# Patient Record
Sex: Male | Born: 2000
Health system: Southern US, Community
[De-identification: ages and names within clinical notes are randomized; demographics above are authoritative.]

## PROBLEM LIST (undated history)

## (undated) DIAGNOSIS — R278 Other lack of coordination: Secondary | ICD-10-CM

## (undated) DIAGNOSIS — R488 Other symbolic dysfunctions: Secondary | ICD-10-CM

## (undated) DIAGNOSIS — R41840 Attention and concentration deficit: Secondary | ICD-10-CM

## (undated) DIAGNOSIS — F419 Anxiety disorder, unspecified: Secondary | ICD-10-CM

## (undated) HISTORY — PX: HERNIA REPAIR: SHX51

## (undated) HISTORY — DX: Anxiety disorder, unspecified: F41.9

## (undated) HISTORY — DX: Other symbolic dysfunctions: R48.8

## (undated) HISTORY — DX: Other lack of coordination: R27.8

---
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
## 2018-01-10 ENCOUNTER — Other Ambulatory Visit: Payer: Self-pay | Admitting: Pediatrics

## 2018-01-11 MED ORDER — GUANFACINE HCL ER 4 MG PO TB24: 4.0000 mg | ORAL_TABLET | Freq: Every day | ORAL | 0 refills | Status: DC

## 2018-01-11 NOTE — Telephone Encounter (Signed)
RX for above e-scribed and sent to pharmacy on record  [place] [facility] [place], [place] - [address] [place] [address] Phone: [phone] Fax: [phone]    

## 2018-01-11 NOTE — Telephone Encounter (Signed)
Last  Visit 10/30/2017 Next Visit 03/02/18

## 2018-01-29 ENCOUNTER — Other Ambulatory Visit: Payer: Self-pay

## 2018-01-29 MED ORDER — METHYLPHENIDATE HCL ER (OSM) 72 MG PO TBCR: 1.0000 | EXTENDED_RELEASE_TABLET | Freq: Every morning | ORAL | 0 refills | Status: DC

## 2018-01-29 NOTE — Telephone Encounter (Signed)
Mom called in for refill for Concerta. Last visit 10/30/2017 next visit 02/20/2018. Please escribe to [facility] in [facility]

## 2018-02-20 ENCOUNTER — Encounter: Payer: Self-pay | Admitting: Pediatrics

## 2018-02-20 ENCOUNTER — Ambulatory Visit (INDEPENDENT_AMBULATORY_CARE_PROVIDER_SITE_OTHER): Payer: PRIVATE HEALTH INSURANCE | Admitting: Pediatrics

## 2018-02-20 VITALS — BP 120/80 | Ht 72.5 in | Wt 175.0 lb

## 2018-02-20 DIAGNOSIS — R278 Other lack of coordination: Secondary | ICD-10-CM

## 2018-02-20 DIAGNOSIS — R488 Other symbolic dysfunctions: Secondary | ICD-10-CM

## 2018-02-20 DIAGNOSIS — Z79899 Other long term (current) drug therapy: Secondary | ICD-10-CM

## 2018-02-20 DIAGNOSIS — Z719 Counseling, unspecified: Secondary | ICD-10-CM

## 2018-02-20 DIAGNOSIS — F902 Attention-deficit hyperactivity disorder, combined type: Secondary | ICD-10-CM

## 2018-02-20 DIAGNOSIS — F411 Generalized anxiety disorder: Secondary | ICD-10-CM

## 2018-02-20 DIAGNOSIS — Z7189 Other specified counseling: Secondary | ICD-10-CM

## 2018-02-20 MED ORDER — GUANFACINE HCL ER 4 MG PO TB24: 4.0000 mg | ORAL_TABLET | Freq: Every day | ORAL | 0 refills | Status: AC

## 2018-02-20 MED ORDER — METHYLPHENIDATE HCL ER (OSM) 72 MG PO TBCR: 1.0000 | EXTENDED_RELEASE_TABLET | Freq: Every morning | ORAL | 0 refills | Status: AC

## 2018-02-20 MED ORDER — ATOMOXETINE HCL 100 MG PO CAPS: 100.0000 mg | ORAL_CAPSULE | Freq: Every day | ORAL | 0 refills | Status: AC

## 2018-02-20 NOTE — Patient Instructions (Addendum)
Continue strattera 100 mg daily  intuniv 4 mg daily  concerta 72 mg every morning Discussed medication and dosing Discussed growth and development-good BMI Discussed sports injury-strongly recommend PT, has referral Discussed relationships Discussed educational future-will continue at [employer] until he decides what he wants to do
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
## 2019-10-23 ENCOUNTER — Encounter (HOSPITAL_COMMUNITY): Payer: Self-pay | Admitting: Emergency Medicine

## 2019-10-23 ENCOUNTER — Other Ambulatory Visit: Payer: Self-pay

## 2019-10-23 ENCOUNTER — Emergency Department (HOSPITAL_COMMUNITY): Payer: BC Managed Care – PPO

## 2019-10-23 ENCOUNTER — Emergency Department (HOSPITAL_COMMUNITY)
Admission: EM | Admit: 2019-10-23 | Discharge: 2019-10-24 | Disposition: A | Discharge status: Home / Self Care | Payer: BC Managed Care – PPO | Attending: Emergency Medicine | Admitting: Emergency Medicine

## 2019-10-23 DIAGNOSIS — N50811 Right testicular pain: Secondary | ICD-10-CM | POA: Insufficient documentation

## 2019-10-23 DIAGNOSIS — N452 Orchitis: Secondary | ICD-10-CM | POA: Insufficient documentation

## 2019-10-23 DIAGNOSIS — N50819 Testicular pain, unspecified: Secondary | ICD-10-CM

## 2019-10-23 LAB — URINALYSIS, ROUTINE W REFLEX MICROSCOPIC
Bilirubin Urine: NEGATIVE
Glucose, UA: NEGATIVE mg/dL
Hgb urine dipstick: NEGATIVE
Ketones, ur: NEGATIVE mg/dL
Leukocytes,Ua: NEGATIVE
Nitrite: NEGATIVE
Protein, ur: NEGATIVE mg/dL
Specific Gravity, Urine: 1.027 (ref 1.005–1.030)
pH: 5 (ref 5.0–8.0)

## 2019-10-23 NOTE — ED Triage Notes (Signed)
Pt c/o increase testicle pain for the past few days. Seen on UC for same pt states he doesn't have any relief.

## 2019-10-24 MED ORDER — KETOROLAC TROMETHAMINE 30 MG/ML IJ SOLN
30.0000 mg | Freq: Once | INTRAMUSCULAR | Status: AC
  Administered 2019-10-24: 30 mg via INTRAMUSCULAR
  Filled 2019-10-24: qty 1

## 2019-10-24 MED ORDER — CEFTRIAXONE SODIUM 500 MG IJ SOLR
500.0000 mg | Freq: Once | INTRAMUSCULAR | Status: AC
  Administered 2019-10-24: 500 mg via INTRAMUSCULAR
  Filled 2019-10-24: qty 500

## 2019-10-24 MED ORDER — DOXYCYCLINE HYCLATE 100 MG PO CAPS
100.0000 mg | ORAL_CAPSULE | Freq: Two times a day (BID) | ORAL | 0 refills | Status: AC
Start: 2019-10-24 — End: 2019-11-03

## 2019-10-24 MED ORDER — LIDOCAINE HCL (PF) 1 % IJ SOLN
INTRAMUSCULAR | Status: AC
  Administered 2019-10-24: 5 mL
  Filled 2019-10-24: qty 5

## 2019-10-24 NOTE — Discharge Instructions (Addendum)
I have prescribed antibiotics in order to help treat your likely infection.  Please take 1 tablet twice a day for the next 10 days.  You will need to schedule an appointment with [facility] in order to reevaluate your right testicular swelling.

## 2019-10-24 NOTE — ED Provider Notes (Signed)
[facility] EMERGENCY DEPARTMENT Provider Note   CSN: [id number] Arrival date & time: 10/23/19  1353     History Chief Complaint  Patient presents with  . Testicle Pain    Jonathan French is a 19 y.o. male.  19 y.o male with a  PMH of Anxiety, ADHD presents to the ED with a chief complaint of right testicular pain for the past few days. Patient was evaluated at [facility] on two days ago and diagnosed with inguinal hernia. He reports no improvement in his symptoms.  Has been taking ibuprofen without relieved. He does report some discomfort after voiding but no dysuria or hematuria. Patient is currently sexually active but reports using condoms, last activity about 2 months ago. He denies any fever, abdominal pain, nausea, or vomiting.    The history is provided by the patient.  Testicle Pain Pertinent negatives include no chest pain and no shortness of breath.       Past Medical History:  Diagnosis Date  . Anxiety   . Attention deficit   . Developmental dysgraphia     Patient Active Problem List   Diagnosis Date Noted  . ADHD (attention deficit hyperactivity disorder), combined type 05/29/2015  . Dysgraphia 05/29/2015  . Generalized anxiety disorder 05/29/2015    Past Surgical History:  Procedure Laterality Date  . HERNIA REPAIR         Family History  Adopted: Yes  Family history unknown: Yes    Social History   Tobacco Use  . Smoking status: Never Smoker  . Smokeless tobacco: Never Used  Substance Use Topics  . Alcohol use: No    Alcohol/week: 0.0 standard drinks  . Drug use: No    Home Medications Prior to Admission medications   Medication Sig Start Date End Date Taking? Authorizing Provider  ibuprofen (ADVIL) 600 MG tablet Take 600 mg by mouth every 6 (six) hours as needed for mild pain.  10/22/19 11/01/19 Yes [provider]  atomoxetine (STRATTERA) 100 MG capsule Take 1 capsule (100 mg total) by mouth daily. Patient not  taking: Reported on 10/24/2019 02/20/18   Nicholos Johns, NP  doxycycline (VIBRAMYCIN) 100 MG capsule Take 1 capsule (100 mg total) by mouth 2 (two) times daily for 10 days. 10/24/19 11/03/19  Claude Manges, PA-C  guanFACINE (INTUNIV) 4 MG TB24 ER tablet Take 1 tablet (4 mg total) by mouth daily. Patient not taking: Reported on 10/24/2019 02/20/18   Nicholos Johns, NP  Methylphenidate HCl ER 72 MG TBCR Take 1 tablet by mouth every morning. Patient not taking: Reported on 10/24/2019 02/20/18   Nicholos Johns, NP    Allergies    Patient has no known allergies.  Review of Systems   Review of Systems  Constitutional: Negative for chills and fever.  Respiratory: Negative for shortness of breath.   Cardiovascular: Negative for chest pain.  Genitourinary: Positive for scrotal swelling and testicular pain. Negative for discharge, genital sores, hematuria, penile pain and penile swelling.  All other systems reviewed and are negative.   Physical Exam Updated Vital Signs BP (!) 140/53   Pulse (!) 53   Temp 98.1 F (36.7 C) (Oral)   Resp 16   Ht 6' (1.829 m)   Wt 79.4 kg   SpO2 98%   BMI 23.74 kg/m   Physical Exam  ED Results / Procedures / Treatments   Labs (all labs ordered are listed, but only abnormal results are displayed) Labs Reviewed  URINALYSIS, ROUTINE  W REFLEX MICROSCOPIC  GC/CHLAMYDIA PROBE AMP ([place]) NOT AT [facility]    EKG None  Radiology US SCROTUM W/DOPPLER  Result Date: 10/23/2019 CLINICAL DATA:  Right testicular pain EXAM: SCROTAL ULTRASOUND DOPPLER ULTRASOUND OF THE TESTICLES TECHNIQUE: Complete ultrasound examination of the testicles, epididymis, and other scrotal structures was performed. Color and spectral Doppler ultrasound were also utilized to evaluate blood flow to the testicles. COMPARISON:  None. FINDINGS: Right testicle Measurements: 4.6 x 2.6 x 3.4 cm. No mass or microlithiasis visualized. Left testicle Measurements: 4.3 x 2.3 x 3.2 cm. There is no mass.  There is increased vascularity. Right epididymis:  There is an epididymal head cyst measuring 5 mm. Left epididymis:  Normal in size and appearance. Hydrocele:  There are trace to small bilateral hydroceles. Varicocele:  There is a borderline left-sided varicocele. Pulsed Doppler interrogation of both testes demonstrates normal low resistance arterial and venous waveforms bilaterally. IMPRESSION: 1. No evidence for testicular torsion. 2. Hypervascularity of the LEFT testicle can be seen in patients with orchitis. 3. No testicular mass. 4. Borderline left-sided varicocele. 5. Small bilateral hydroceles. Electronically Signed   By: Katherine Mantle M.D.   On: 10/23/2019 16:01    Procedures Procedures (including critical care time)  Medications Ordered in ED Medications  cefTRIAXone (ROCEPHIN) injection 500 mg (500 mg Intramuscular Given 10/24/19 0258)  ketorolac (TORADOL) 30 MG/ML injection 30 mg (30 mg Intramuscular Given 10/24/19 0301)  lidocaine (PF) (XYLOCAINE) 1 % injection (5 mLs  Given 10/24/19 0302)    ED Course  I have reviewed the triage vital signs and the nursing notes.  Pertinent labs & imaging results that were available during my care of the patient were reviewed by me and considered in my medical decision making (see chart for details).    MDM Rules/Calculators/A&P  Patient with no pertinent past medical history presents to the ED with complaints of right testicular pain which began 3 days ago, seen by urgent care and diagnosed with a right inguinal hernia, given ibuprofen for symptomatic relief reports no improvement at this time.  Sexually active, last encounter about 2 months ago, reports using condoms for interactions.  Has not had any penile discharge, penile complaints.  Ultrasound of his scrotum was obtained which showed:  1. No evidence for testicular torsion.  2. Hypervascularity of the LEFT testicle can be seen in patients  with orchitis.  3. No testicular mass.  4.  Borderline left-sided varicocele.  5. Small bilateral hydroceles.     UA without any nitrites, leukocytes, no signs of infection.  GC and chlamydia have been added to patient's urine.  Denies any penile discharge, does have right testicular swelling, examined with nurse Jasmine at the bedside.  Tender to palpation along with mild erythema noted to right testicle.  Patient received in the ED a shot of Rocephin to cover for gonorrhea, he will also be sent home on doxycycline to help with chlamydia treatment.  I discussed with him that he will need to follow-up with [facility] as this is relevant to his care.  Patient is agreeable at this time.  Also received a shot of Toradol to help with swelling.  Vitals remained stable, patient stable for discharge.  Portions of this note were generated with Scientist, clinical (histocompatibility and immunogenetics). Dictation errors may occur despite best attempts at proofreading.  Final Clinical Impression(s) / ED Diagnoses Final diagnoses:  Pain in right testicle  Orchitis    Rx / DC Orders ED Discharge Orders  Ordered    doxycycline (VIBRAMYCIN) 100 MG capsule  2 times daily     Discontinue  Reprint     10/24/19 0245           Claude Manges, PA-C 10/24/19 0343    Melene Plan, DO 10/24/19 [phone]

## 2019-10-25 LAB — GC/CHLAMYDIA PROBE AMP (~~LOC~~) NOT AT ARMC
Chlamydia: NEGATIVE
Comment: NEGATIVE
Comment: NORMAL
Neisseria Gonorrhea: NEGATIVE

## 2020-06-03 DIAGNOSIS — J0110 Acute frontal sinusitis, unspecified: Secondary | ICD-10-CM | POA: Diagnosis not present

## 2020-06-10 DIAGNOSIS — M25551 Pain in right hip: Secondary | ICD-10-CM | POA: Diagnosis not present

## 2020-06-10 DIAGNOSIS — M25552 Pain in left hip: Secondary | ICD-10-CM | POA: Diagnosis not present

## 2020-06-10 DIAGNOSIS — Z4789 Encounter for other orthopedic aftercare: Secondary | ICD-10-CM | POA: Diagnosis not present

## 2020-06-22 DIAGNOSIS — Z720 Tobacco use: Secondary | ICD-10-CM | POA: Diagnosis not present

## 2020-06-22 DIAGNOSIS — N50819 Testicular pain, unspecified: Secondary | ICD-10-CM | POA: Diagnosis not present

## 2020-06-22 DIAGNOSIS — Z87438 Personal history of other diseases of male genital organs: Secondary | ICD-10-CM | POA: Diagnosis not present

## 2020-06-22 DIAGNOSIS — R1030 Lower abdominal pain, unspecified: Secondary | ICD-10-CM | POA: Diagnosis not present

## 2020-06-22 DIAGNOSIS — N433 Hydrocele, unspecified: Secondary | ICD-10-CM | POA: Diagnosis not present

## 2020-06-22 DIAGNOSIS — N50811 Right testicular pain: Secondary | ICD-10-CM | POA: Diagnosis not present

## 2020-06-29 DIAGNOSIS — M25552 Pain in left hip: Secondary | ICD-10-CM | POA: Diagnosis not present

## 2020-06-29 DIAGNOSIS — M25551 Pain in right hip: Secondary | ICD-10-CM | POA: Diagnosis not present

## 2020-06-29 DIAGNOSIS — Z4789 Encounter for other orthopedic aftercare: Secondary | ICD-10-CM | POA: Diagnosis not present

## 2020-08-03 DIAGNOSIS — M76821 Posterior tibial tendinitis, right leg: Secondary | ICD-10-CM | POA: Diagnosis not present

## 2020-08-03 DIAGNOSIS — M7671 Peroneal tendinitis, right leg: Secondary | ICD-10-CM | POA: Diagnosis not present

## 2020-08-03 DIAGNOSIS — M25571 Pain in right ankle and joints of right foot: Secondary | ICD-10-CM | POA: Diagnosis not present

## 2020-08-03 DIAGNOSIS — M79671 Pain in right foot: Secondary | ICD-10-CM | POA: Diagnosis not present

## 2020-08-04 DIAGNOSIS — M25552 Pain in left hip: Secondary | ICD-10-CM | POA: Diagnosis not present

## 2020-08-04 DIAGNOSIS — Z9889 Other specified postprocedural states: Secondary | ICD-10-CM | POA: Diagnosis not present

## 2020-08-04 DIAGNOSIS — M25551 Pain in right hip: Secondary | ICD-10-CM | POA: Diagnosis not present

## 2020-09-02 DIAGNOSIS — K4090 Unilateral inguinal hernia, without obstruction or gangrene, not specified as recurrent: Secondary | ICD-10-CM | POA: Diagnosis not present

## 2020-09-03 DIAGNOSIS — K4090 Unilateral inguinal hernia, without obstruction or gangrene, not specified as recurrent: Secondary | ICD-10-CM | POA: Diagnosis not present

## 2020-09-03 DIAGNOSIS — D176 Benign lipomatous neoplasm of spermatic cord: Secondary | ICD-10-CM | POA: Diagnosis not present

## 2020-09-14 DIAGNOSIS — S86311A Strain of muscle(s) and tendon(s) of peroneal muscle group at lower leg level, right leg, initial encounter: Secondary | ICD-10-CM | POA: Diagnosis not present

## 2020-09-14 DIAGNOSIS — S93491A Sprain of other ligament of right ankle, initial encounter: Secondary | ICD-10-CM | POA: Diagnosis not present

## 2020-09-14 DIAGNOSIS — M7671 Peroneal tendinitis, right leg: Secondary | ICD-10-CM | POA: Diagnosis not present

## 2020-09-14 DIAGNOSIS — X500XXA Overexertion from strenuous movement or load, initial encounter: Secondary | ICD-10-CM | POA: Diagnosis not present

## 2020-09-22 DIAGNOSIS — F902 Attention-deficit hyperactivity disorder, combined type: Secondary | ICD-10-CM | POA: Diagnosis not present

## 2020-09-22 DIAGNOSIS — F4323 Adjustment disorder with mixed anxiety and depressed mood: Secondary | ICD-10-CM | POA: Diagnosis not present

## 2020-09-22 DIAGNOSIS — Z561 Change of job: Secondary | ICD-10-CM | POA: Diagnosis not present

## 2020-09-22 DIAGNOSIS — Z563 Stressful work schedule: Secondary | ICD-10-CM | POA: Diagnosis not present

## 2020-09-28 DIAGNOSIS — F4323 Adjustment disorder with mixed anxiety and depressed mood: Secondary | ICD-10-CM | POA: Diagnosis not present

## 2020-09-28 DIAGNOSIS — F901 Attention-deficit hyperactivity disorder, predominantly hyperactive type: Secondary | ICD-10-CM | POA: Diagnosis not present

## 2020-09-29 DIAGNOSIS — F4323 Adjustment disorder with mixed anxiety and depressed mood: Secondary | ICD-10-CM | POA: Diagnosis not present

## 2020-09-29 DIAGNOSIS — Z561 Change of job: Secondary | ICD-10-CM | POA: Diagnosis not present

## 2020-09-29 DIAGNOSIS — F5105 Insomnia due to other mental disorder: Secondary | ICD-10-CM | POA: Diagnosis not present

## 2020-09-29 DIAGNOSIS — F902 Attention-deficit hyperactivity disorder, combined type: Secondary | ICD-10-CM | POA: Diagnosis not present

## 2020-10-08 DIAGNOSIS — F411 Generalized anxiety disorder: Secondary | ICD-10-CM | POA: Diagnosis not present

## 2020-10-08 DIAGNOSIS — Z561 Change of job: Secondary | ICD-10-CM | POA: Diagnosis not present

## 2020-10-08 DIAGNOSIS — F5105 Insomnia due to other mental disorder: Secondary | ICD-10-CM | POA: Diagnosis not present

## 2020-10-08 DIAGNOSIS — F902 Attention-deficit hyperactivity disorder, combined type: Secondary | ICD-10-CM | POA: Diagnosis not present

## 2020-10-12 DIAGNOSIS — F4310 Post-traumatic stress disorder, unspecified: Secondary | ICD-10-CM | POA: Diagnosis not present

## 2020-10-12 DIAGNOSIS — F411 Generalized anxiety disorder: Secondary | ICD-10-CM | POA: Diagnosis not present

## 2020-10-12 DIAGNOSIS — F909 Attention-deficit hyperactivity disorder, unspecified type: Secondary | ICD-10-CM | POA: Diagnosis not present

## 2020-10-19 DIAGNOSIS — F4323 Adjustment disorder with mixed anxiety and depressed mood: Secondary | ICD-10-CM | POA: Diagnosis not present

## 2020-10-19 DIAGNOSIS — F5105 Insomnia due to other mental disorder: Secondary | ICD-10-CM | POA: Diagnosis not present

## 2020-10-19 DIAGNOSIS — F411 Generalized anxiety disorder: Secondary | ICD-10-CM | POA: Diagnosis not present

## 2020-10-19 DIAGNOSIS — F902 Attention-deficit hyperactivity disorder, combined type: Secondary | ICD-10-CM | POA: Diagnosis not present

## 2020-10-20 DIAGNOSIS — F909 Attention-deficit hyperactivity disorder, unspecified type: Secondary | ICD-10-CM | POA: Diagnosis not present

## 2020-10-20 DIAGNOSIS — F411 Generalized anxiety disorder: Secondary | ICD-10-CM | POA: Diagnosis not present

## 2020-10-20 DIAGNOSIS — F4310 Post-traumatic stress disorder, unspecified: Secondary | ICD-10-CM | POA: Diagnosis not present

## 2020-10-26 DIAGNOSIS — F411 Generalized anxiety disorder: Secondary | ICD-10-CM | POA: Diagnosis not present

## 2020-10-26 DIAGNOSIS — F4310 Post-traumatic stress disorder, unspecified: Secondary | ICD-10-CM | POA: Diagnosis not present

## 2020-10-26 DIAGNOSIS — F909 Attention-deficit hyperactivity disorder, unspecified type: Secondary | ICD-10-CM | POA: Diagnosis not present

## 2020-11-02 DIAGNOSIS — F909 Attention-deficit hyperactivity disorder, unspecified type: Secondary | ICD-10-CM | POA: Diagnosis not present

## 2020-11-02 DIAGNOSIS — F411 Generalized anxiety disorder: Secondary | ICD-10-CM | POA: Diagnosis not present

## 2020-11-02 DIAGNOSIS — F4310 Post-traumatic stress disorder, unspecified: Secondary | ICD-10-CM | POA: Diagnosis not present

## 2020-11-05 DIAGNOSIS — F902 Attention-deficit hyperactivity disorder, combined type: Secondary | ICD-10-CM | POA: Diagnosis not present

## 2020-11-05 DIAGNOSIS — F411 Generalized anxiety disorder: Secondary | ICD-10-CM | POA: Diagnosis not present

## 2020-11-05 DIAGNOSIS — F4323 Adjustment disorder with mixed anxiety and depressed mood: Secondary | ICD-10-CM | POA: Diagnosis not present

## 2020-11-05 DIAGNOSIS — F5105 Insomnia due to other mental disorder: Secondary | ICD-10-CM | POA: Diagnosis not present

## 2020-11-09 DIAGNOSIS — F909 Attention-deficit hyperactivity disorder, unspecified type: Secondary | ICD-10-CM | POA: Diagnosis not present

## 2020-11-09 DIAGNOSIS — F411 Generalized anxiety disorder: Secondary | ICD-10-CM | POA: Diagnosis not present

## 2020-11-09 DIAGNOSIS — F4310 Post-traumatic stress disorder, unspecified: Secondary | ICD-10-CM | POA: Diagnosis not present

## 2020-12-28 DIAGNOSIS — F4323 Adjustment disorder with mixed anxiety and depressed mood: Secondary | ICD-10-CM | POA: Diagnosis not present

## 2020-12-28 DIAGNOSIS — F5105 Insomnia due to other mental disorder: Secondary | ICD-10-CM | POA: Diagnosis not present

## 2020-12-28 DIAGNOSIS — F902 Attention-deficit hyperactivity disorder, combined type: Secondary | ICD-10-CM | POA: Diagnosis not present

## 2020-12-28 DIAGNOSIS — F411 Generalized anxiety disorder: Secondary | ICD-10-CM | POA: Diagnosis not present

## 2021-03-11 DIAGNOSIS — F411 Generalized anxiety disorder: Secondary | ICD-10-CM | POA: Diagnosis not present

## 2021-03-11 DIAGNOSIS — F4323 Adjustment disorder with mixed anxiety and depressed mood: Secondary | ICD-10-CM | POA: Diagnosis not present

## 2021-03-11 DIAGNOSIS — F902 Attention-deficit hyperactivity disorder, combined type: Secondary | ICD-10-CM | POA: Diagnosis not present

## 2021-03-11 DIAGNOSIS — F5105 Insomnia due to other mental disorder: Secondary | ICD-10-CM | POA: Diagnosis not present

## 2021-06-21 DIAGNOSIS — F411 Generalized anxiety disorder: Secondary | ICD-10-CM | POA: Diagnosis not present

## 2021-06-21 DIAGNOSIS — F5105 Insomnia due to other mental disorder: Secondary | ICD-10-CM | POA: Diagnosis not present

## 2021-06-21 DIAGNOSIS — F902 Attention-deficit hyperactivity disorder, combined type: Secondary | ICD-10-CM | POA: Diagnosis not present

## 2021-06-21 DIAGNOSIS — F4323 Adjustment disorder with mixed anxiety and depressed mood: Secondary | ICD-10-CM | POA: Diagnosis not present

## 2021-08-12 DIAGNOSIS — F902 Attention-deficit hyperactivity disorder, combined type: Secondary | ICD-10-CM | POA: Diagnosis not present

## 2021-08-12 DIAGNOSIS — F5105 Insomnia due to other mental disorder: Secondary | ICD-10-CM | POA: Diagnosis not present

## 2021-08-12 DIAGNOSIS — F411 Generalized anxiety disorder: Secondary | ICD-10-CM | POA: Diagnosis not present

## 2022-02-25 IMAGING — US US SCROTUM W/ DOPPLER COMPLETE
1 series · 14 of 25 positions shown · non-contrast
Comparison: None.

CLINICAL DATA: Right testicular pain

EXAM:
SCROTAL ULTRASOUND
DOPPLER ULTRASOUND OF THE TESTICLES
TECHNIQUE: Complete ultrasound examination of the testicles, epididymis, and
other scrotal structures was performed. Color and spectral Doppler
ultrasound were also utilized to evaluate blood flow to the
testicles.

[Series 1: us scrotum w/doppler · 14 of 74 slices shown]
[im 1/74]
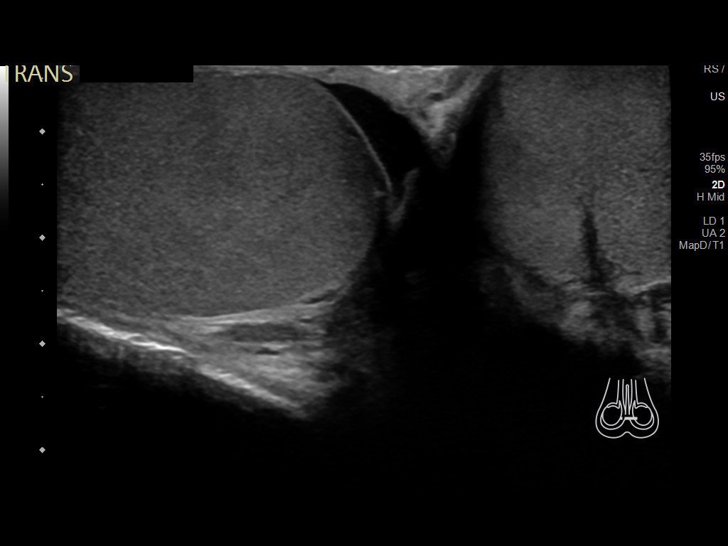
[im 7/74]
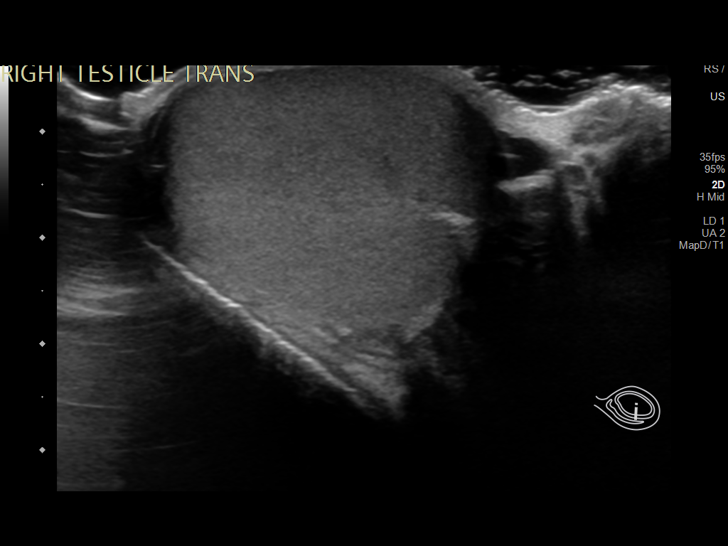
[im 13/74]
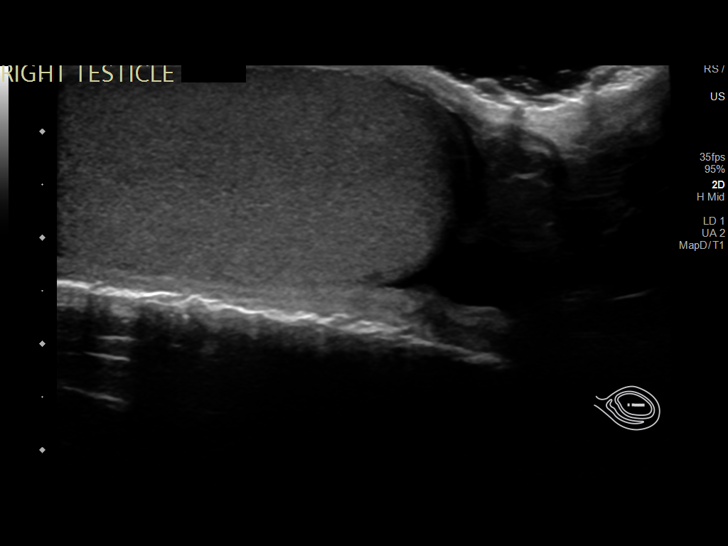
[im 19/74]
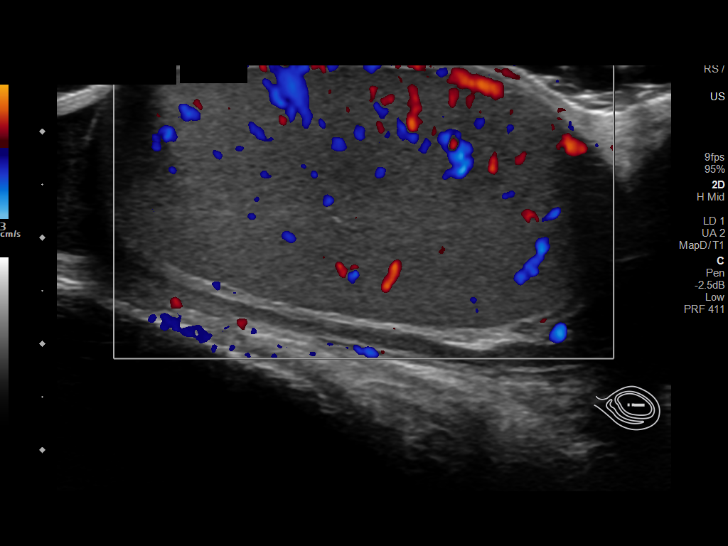
[im 25/74]
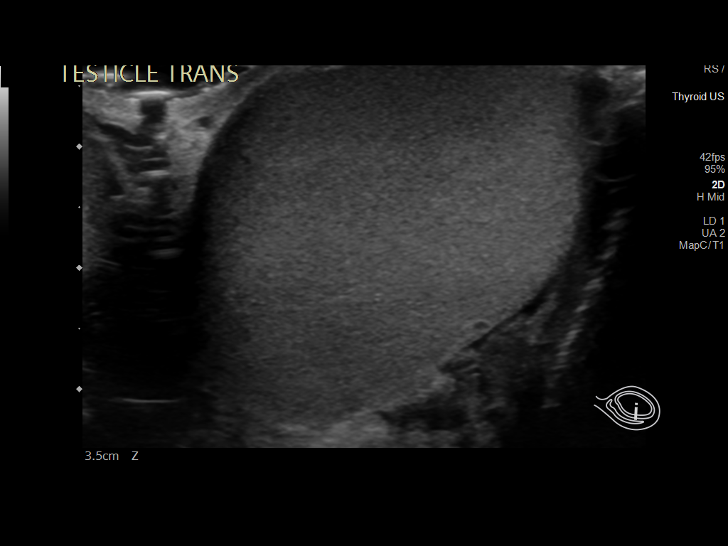
[im 28/74]
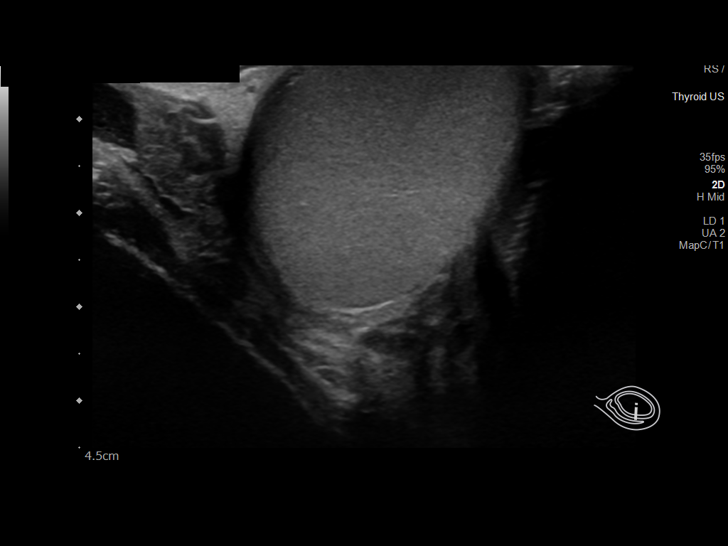
[im 34/74]
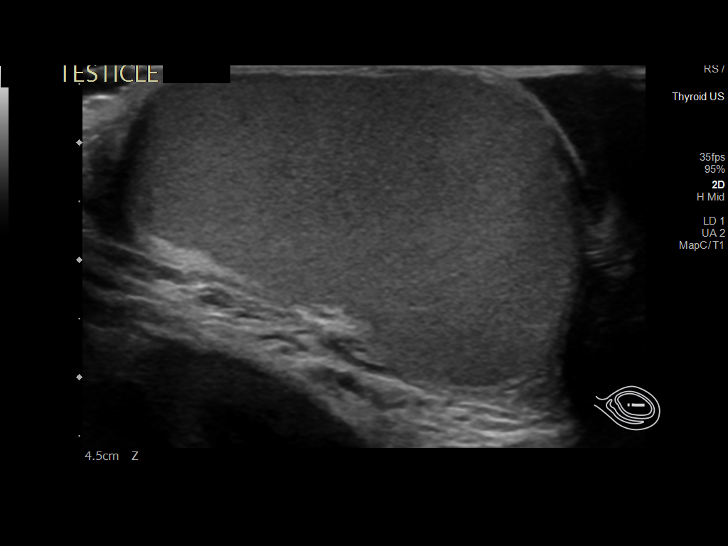
[im 40/74]
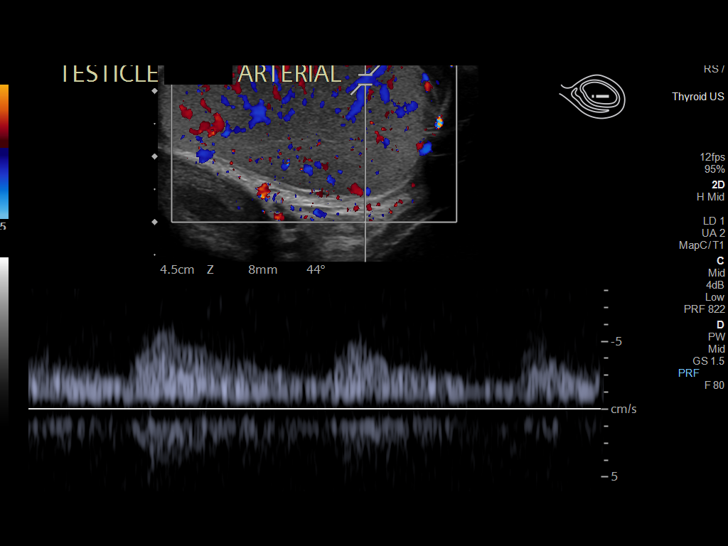
[im 46/74]
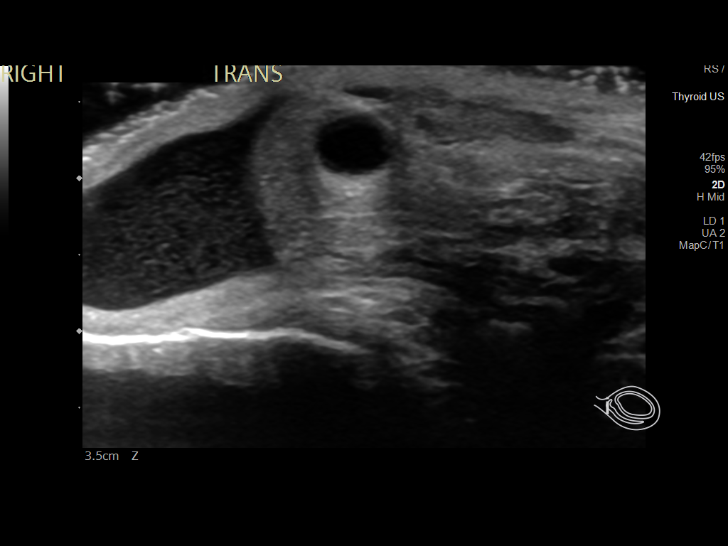
[im 49/74]
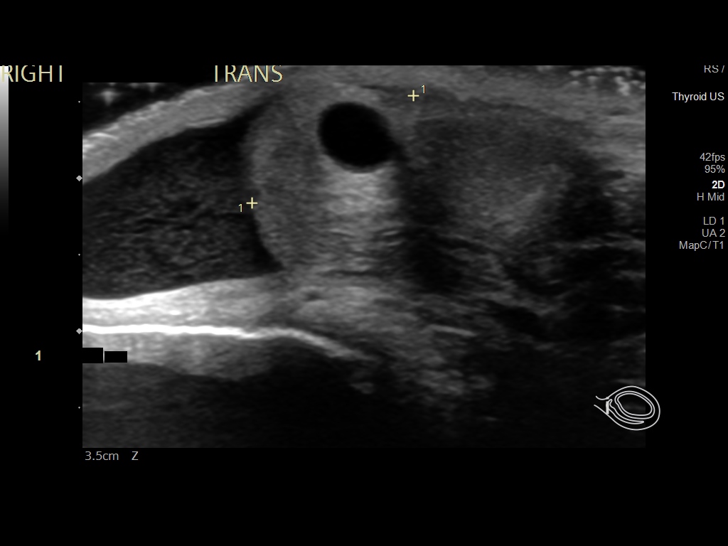
[im 55/74]
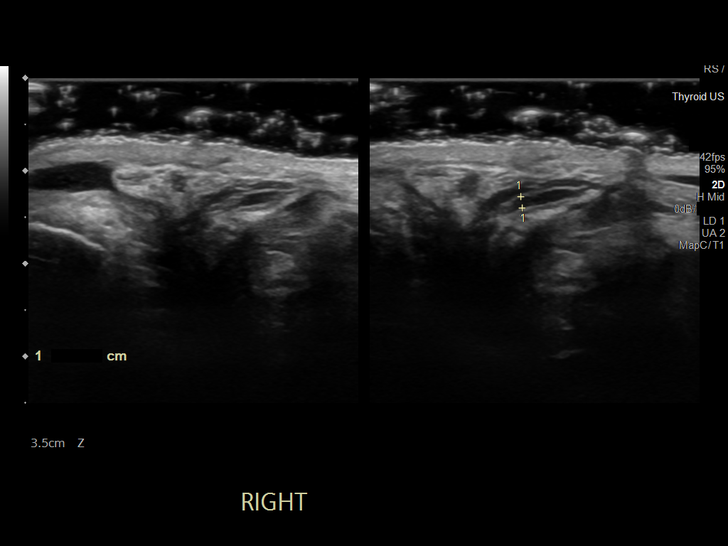
[im 61/74]
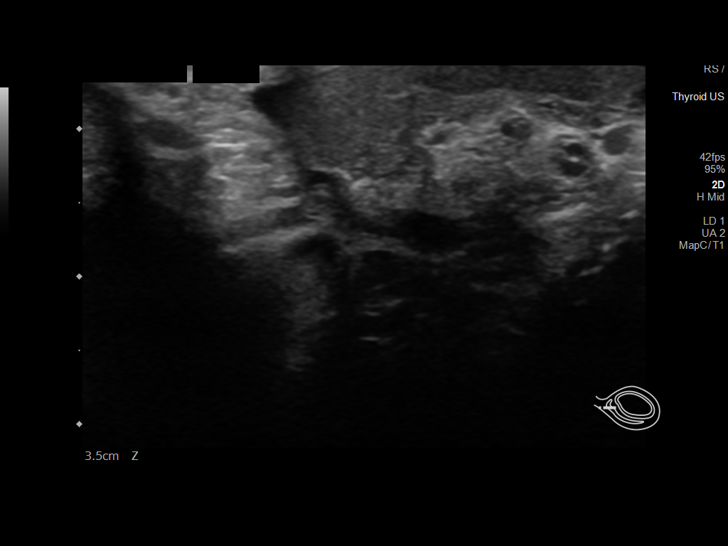
[im 67/74]
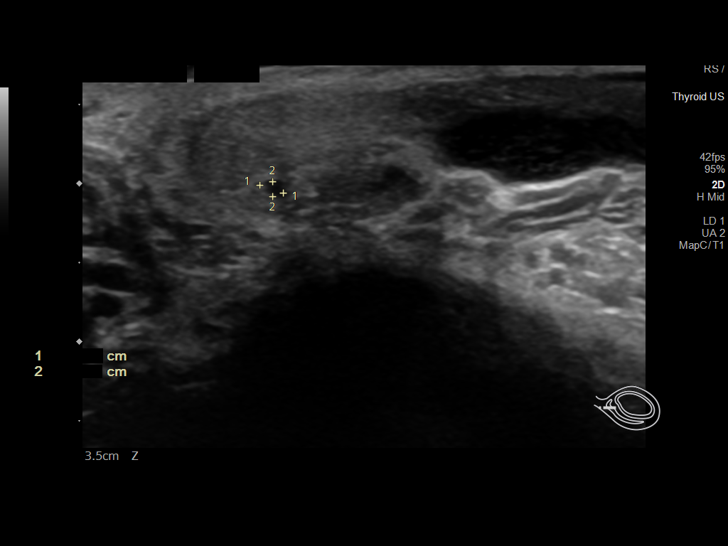
[im 74/74]
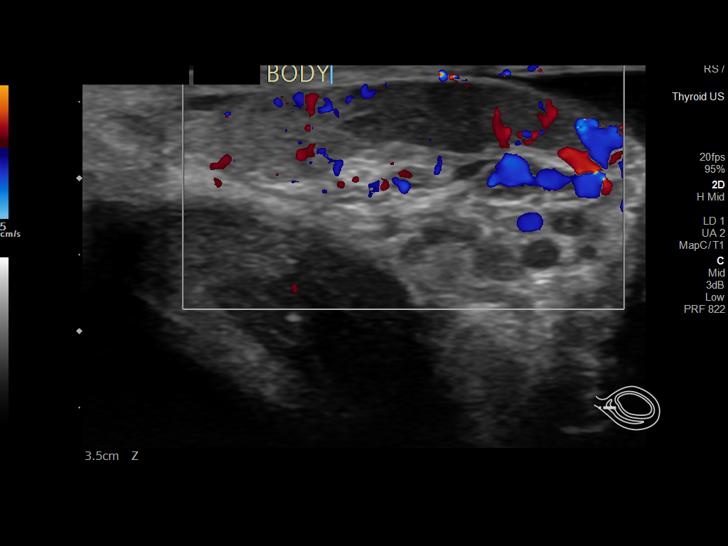

[14 of 25 positions shown; findings below may reference images not displayed]

FINDINGS: Right testicle

Measurements: 4.6 x 2.6 x 3.4 cm. No mass or microlithiasis
visualized.

Left testicle

Measurements: 4.3 x 2.3 x 3.2 cm. There is no mass. There is
increased vascularity.

Right epididymis:  There is an epididymal head cyst measuring 5 mm.

Left epididymis:  Normal in size and appearance.

Hydrocele:  There are trace to small bilateral hydroceles.

Varicocele:  There is a borderline left-sided varicocele.

Pulsed Doppler interrogation of both testes demonstrates normal low
resistance arterial and venous waveforms bilaterally.
IMPRESSION: 1. No evidence for testicular torsion.
2. Hypervascularity of the LEFT testicle can be seen in patients
with orchitis.
3. No testicular mass.
4. Borderline left-sided varicocele.
5. Small bilateral hydroceles.

## 2022-03-24 DIAGNOSIS — R000 Tachycardia, unspecified: Secondary | ICD-10-CM | POA: Diagnosis not present

## 2022-03-24 DIAGNOSIS — F909 Attention-deficit hyperactivity disorder, unspecified type: Secondary | ICD-10-CM | POA: Diagnosis not present

## 2022-03-24 DIAGNOSIS — Z23 Encounter for immunization: Secondary | ICD-10-CM | POA: Diagnosis not present

## 2022-03-24 DIAGNOSIS — J45909 Unspecified asthma, uncomplicated: Secondary | ICD-10-CM | POA: Diagnosis not present

## 2022-03-24 DIAGNOSIS — M79644 Pain in right finger(s): Secondary | ICD-10-CM | POA: Diagnosis not present

## 2022-03-24 DIAGNOSIS — S61210A Laceration without foreign body of right index finger without damage to nail, initial encounter: Secondary | ICD-10-CM | POA: Diagnosis not present

## 2022-03-24 DIAGNOSIS — I10 Essential (primary) hypertension: Secondary | ICD-10-CM | POA: Diagnosis not present

## 2022-03-24 DIAGNOSIS — S61310A Laceration without foreign body of right index finger with damage to nail, initial encounter: Secondary | ICD-10-CM | POA: Diagnosis not present

## 2022-03-24 DIAGNOSIS — Z79899 Other long term (current) drug therapy: Secondary | ICD-10-CM | POA: Diagnosis not present

## 2022-03-29 DIAGNOSIS — R451 Restlessness and agitation: Secondary | ICD-10-CM | POA: Diagnosis not present

## 2022-03-29 DIAGNOSIS — F23 Brief psychotic disorder: Secondary | ICD-10-CM | POA: Diagnosis not present

## 2022-03-29 DIAGNOSIS — F1210 Cannabis abuse, uncomplicated: Secondary | ICD-10-CM | POA: Diagnosis not present

## 2022-03-29 DIAGNOSIS — F29 Unspecified psychosis not due to a substance or known physiological condition: Secondary | ICD-10-CM | POA: Diagnosis not present

## 2022-03-29 DIAGNOSIS — Z79899 Other long term (current) drug therapy: Secondary | ICD-10-CM | POA: Diagnosis not present

## 2022-03-29 DIAGNOSIS — Z8659 Personal history of other mental and behavioral disorders: Secondary | ICD-10-CM | POA: Diagnosis not present

## 2022-03-29 DIAGNOSIS — R45851 Suicidal ideations: Secondary | ICD-10-CM | POA: Diagnosis not present

## 2022-03-29 DIAGNOSIS — F32A Depression, unspecified: Secondary | ICD-10-CM | POA: Diagnosis not present

## 2022-03-29 DIAGNOSIS — I10 Essential (primary) hypertension: Secondary | ICD-10-CM | POA: Diagnosis not present

## 2022-03-29 DIAGNOSIS — F19150 Other psychoactive substance abuse with psychoactive substance-induced psychotic disorder with delusions: Secondary | ICD-10-CM | POA: Diagnosis not present

## 2022-03-29 DIAGNOSIS — F1220 Cannabis dependence, uncomplicated: Secondary | ICD-10-CM | POA: Diagnosis not present

## 2022-03-29 DIAGNOSIS — Z1152 Encounter for screening for COVID-19: Secondary | ICD-10-CM | POA: Diagnosis not present

## 2022-03-29 DIAGNOSIS — F909 Attention-deficit hyperactivity disorder, unspecified type: Secondary | ICD-10-CM | POA: Diagnosis not present

## 2022-03-30 DIAGNOSIS — Z8659 Personal history of other mental and behavioral disorders: Secondary | ICD-10-CM | POA: Diagnosis not present

## 2022-03-30 DIAGNOSIS — F1220 Cannabis dependence, uncomplicated: Secondary | ICD-10-CM | POA: Diagnosis not present

## 2022-03-30 DIAGNOSIS — F23 Brief psychotic disorder: Secondary | ICD-10-CM | POA: Diagnosis not present

## 2022-03-30 DIAGNOSIS — R451 Restlessness and agitation: Secondary | ICD-10-CM | POA: Diagnosis not present

## 2022-03-30 DIAGNOSIS — F29 Unspecified psychosis not due to a substance or known physiological condition: Secondary | ICD-10-CM | POA: Diagnosis not present

## 2022-03-30 DIAGNOSIS — F1210 Cannabis abuse, uncomplicated: Secondary | ICD-10-CM | POA: Diagnosis not present

## 2022-03-31 DIAGNOSIS — F1010 Alcohol abuse, uncomplicated: Secondary | ICD-10-CM | POA: Diagnosis not present

## 2022-03-31 DIAGNOSIS — Z720 Tobacco use: Secondary | ICD-10-CM | POA: Diagnosis not present

## 2022-03-31 DIAGNOSIS — F19259 Other psychoactive substance dependence with psychoactive substance-induced psychotic disorder, unspecified: Secondary | ICD-10-CM | POA: Diagnosis not present

## 2022-03-31 DIAGNOSIS — F23 Brief psychotic disorder: Secondary | ICD-10-CM | POA: Diagnosis not present

## 2022-03-31 DIAGNOSIS — F1410 Cocaine abuse, uncomplicated: Secondary | ICD-10-CM | POA: Diagnosis not present

## 2022-03-31 DIAGNOSIS — Z781 Physical restraint status: Secondary | ICD-10-CM | POA: Diagnosis not present

## 2022-03-31 DIAGNOSIS — F1210 Cannabis abuse, uncomplicated: Secondary | ICD-10-CM | POA: Diagnosis not present

## 2022-03-31 DIAGNOSIS — F333 Major depressive disorder, recurrent, severe with psychotic symptoms: Secondary | ICD-10-CM | POA: Diagnosis not present

## 2022-03-31 DIAGNOSIS — R45851 Suicidal ideations: Secondary | ICD-10-CM | POA: Diagnosis not present

## 2022-03-31 DIAGNOSIS — F1620 Hallucinogen dependence, uncomplicated: Secondary | ICD-10-CM | POA: Diagnosis not present

## 2022-04-07 DIAGNOSIS — F333 Major depressive disorder, recurrent, severe with psychotic symptoms: Secondary | ICD-10-CM | POA: Diagnosis not present

## 2022-04-15 DIAGNOSIS — F331 Major depressive disorder, recurrent, moderate: Secondary | ICD-10-CM | POA: Diagnosis not present

## 2022-04-25 DIAGNOSIS — F331 Major depressive disorder, recurrent, moderate: Secondary | ICD-10-CM | POA: Diagnosis not present

## 2022-04-29 DIAGNOSIS — F331 Major depressive disorder, recurrent, moderate: Secondary | ICD-10-CM | POA: Diagnosis not present

## 2022-05-10 DIAGNOSIS — F331 Major depressive disorder, recurrent, moderate: Secondary | ICD-10-CM | POA: Diagnosis not present

## 2022-05-13 DIAGNOSIS — F331 Major depressive disorder, recurrent, moderate: Secondary | ICD-10-CM | POA: Diagnosis not present

## 2022-05-17 DIAGNOSIS — F331 Major depressive disorder, recurrent, moderate: Secondary | ICD-10-CM | POA: Diagnosis not present

## 2022-05-24 DIAGNOSIS — F331 Major depressive disorder, recurrent, moderate: Secondary | ICD-10-CM | POA: Diagnosis not present

## 2022-05-31 DIAGNOSIS — F331 Major depressive disorder, recurrent, moderate: Secondary | ICD-10-CM | POA: Diagnosis not present

## 2022-06-13 DIAGNOSIS — F331 Major depressive disorder, recurrent, moderate: Secondary | ICD-10-CM | POA: Diagnosis not present

## 2022-06-21 DIAGNOSIS — F331 Major depressive disorder, recurrent, moderate: Secondary | ICD-10-CM | POA: Diagnosis not present

## 2022-06-28 DIAGNOSIS — F331 Major depressive disorder, recurrent, moderate: Secondary | ICD-10-CM | POA: Diagnosis not present

## 2022-08-30 DIAGNOSIS — F331 Major depressive disorder, recurrent, moderate: Secondary | ICD-10-CM | POA: Diagnosis not present

## 2023-05-23 DIAGNOSIS — J45909 Unspecified asthma, uncomplicated: Secondary | ICD-10-CM | POA: Diagnosis not present

## 2023-05-23 DIAGNOSIS — Z79899 Other long term (current) drug therapy: Secondary | ICD-10-CM | POA: Diagnosis not present

## 2023-05-23 DIAGNOSIS — F17220 Nicotine dependence, chewing tobacco, uncomplicated: Secondary | ICD-10-CM | POA: Diagnosis not present

## 2023-05-23 DIAGNOSIS — K0889 Other specified disorders of teeth and supporting structures: Secondary | ICD-10-CM | POA: Diagnosis not present

## 2023-05-23 DIAGNOSIS — F909 Attention-deficit hyperactivity disorder, unspecified type: Secondary | ICD-10-CM | POA: Diagnosis not present

## 2023-09-18 DIAGNOSIS — J019 Acute sinusitis, unspecified: Secondary | ICD-10-CM | POA: Diagnosis not present

## 2023-09-18 DIAGNOSIS — B9689 Other specified bacterial agents as the cause of diseases classified elsewhere: Secondary | ICD-10-CM | POA: Diagnosis not present

## 2023-11-28 DIAGNOSIS — F4323 Adjustment disorder with mixed anxiety and depressed mood: Secondary | ICD-10-CM | POA: Diagnosis not present

## 2023-12-05 DIAGNOSIS — F4323 Adjustment disorder with mixed anxiety and depressed mood: Secondary | ICD-10-CM | POA: Diagnosis not present

## 2023-12-12 DIAGNOSIS — F4323 Adjustment disorder with mixed anxiety and depressed mood: Secondary | ICD-10-CM | POA: Diagnosis not present

## 2023-12-19 DIAGNOSIS — F4323 Adjustment disorder with mixed anxiety and depressed mood: Secondary | ICD-10-CM | POA: Diagnosis not present

## 2023-12-26 DIAGNOSIS — F4323 Adjustment disorder with mixed anxiety and depressed mood: Secondary | ICD-10-CM | POA: Diagnosis not present

## 2024-01-02 DIAGNOSIS — F4323 Adjustment disorder with mixed anxiety and depressed mood: Secondary | ICD-10-CM | POA: Diagnosis not present

## 2024-01-16 DIAGNOSIS — F4323 Adjustment disorder with mixed anxiety and depressed mood: Secondary | ICD-10-CM | POA: Diagnosis not present

## 2024-01-23 DIAGNOSIS — F4323 Adjustment disorder with mixed anxiety and depressed mood: Secondary | ICD-10-CM | POA: Diagnosis not present

## 2024-01-30 DIAGNOSIS — F4323 Adjustment disorder with mixed anxiety and depressed mood: Secondary | ICD-10-CM | POA: Diagnosis not present

## 2024-02-13 DIAGNOSIS — F4323 Adjustment disorder with mixed anxiety and depressed mood: Secondary | ICD-10-CM | POA: Diagnosis not present

## 2024-02-20 DIAGNOSIS — F4323 Adjustment disorder with mixed anxiety and depressed mood: Secondary | ICD-10-CM | POA: Diagnosis not present

## 2024-02-27 DIAGNOSIS — F4323 Adjustment disorder with mixed anxiety and depressed mood: Secondary | ICD-10-CM | POA: Diagnosis not present

## 2024-03-05 DIAGNOSIS — F4323 Adjustment disorder with mixed anxiety and depressed mood: Secondary | ICD-10-CM | POA: Diagnosis not present

## 2024-03-12 DIAGNOSIS — F4323 Adjustment disorder with mixed anxiety and depressed mood: Secondary | ICD-10-CM | POA: Diagnosis not present

## 2024-03-19 DIAGNOSIS — F4323 Adjustment disorder with mixed anxiety and depressed mood: Secondary | ICD-10-CM | POA: Diagnosis not present
